# Patient Record
Sex: Male | Born: 1975 | Hispanic: Yes | Marital: Married | Smoking: Never smoker
Health system: Southern US, Community
[De-identification: ages and names within clinical notes are randomized; demographics above are authoritative.]

## PROBLEM LIST (undated history)

## (undated) DIAGNOSIS — I10 Essential (primary) hypertension: Secondary | ICD-10-CM

---

## 2013-10-19 ENCOUNTER — Ambulatory Visit: Payer: Self-pay

## 2015-04-06 ENCOUNTER — Encounter: Payer: Self-pay | Admitting: Gynecology

## 2015-04-06 ENCOUNTER — Ambulatory Visit
Admission: EM | Admit: 2015-04-06 | Discharge: 2015-04-06 | Disposition: A | Discharge status: Home / Self Care | Payer: BLUE CROSS/BLUE SHIELD | Attending: Internal Medicine | Admitting: Internal Medicine

## 2015-04-06 DIAGNOSIS — M5431 Sciatica, right side: Secondary | ICD-10-CM

## 2015-04-06 HISTORY — DX: Essential (primary) hypertension: I10

## 2015-04-06 MED ORDER — KETOROLAC TROMETHAMINE 60 MG/2ML IM SOLN
60.0000 mg | Freq: Once | INTRAMUSCULAR | Status: AC
  Administered 2015-04-06: 60 mg via INTRAMUSCULAR

## 2015-04-06 NOTE — ED Provider Notes (Signed)
CSN: [id number]     Arrival date & time 04/06/15  [id number] History   First MD Initiated Contact with Patient 04/06/15 1128     Chief Complaint  Patient presents with  . Hip Pain   (Consider location/radiation/quality/duration/timing/severity/associated sxs/prior Treatment) HPI 39 yo M has had right low back pain /right buttocks x 6 weeks. Works in Public relations account executivecement construction. Does a lot of pivotal heavy lifting. Has not taken a break. Had a big job under a deadline about 6 weeks ago- lifted heavier loads and  worked long hours -during which time the problem reportedly developed. Has not taken more than an occasional tylenol to treat.Notes discomfort when seated then rising after period of time. Upright walking is not uncomfortable Able to tolerate ibuprofen but usually takes tylenol at home if he takes anything. No paresthesia, no difficulty void, defecate.No thigh or lower leg discomfort. Work environment apparently influential in discomfort. Has mentioned to his boss but was not given any paperwork- so came "for myself" to visit today . Speaks with heavy accent and was offered use of interpreter but he deferred.  Past Medical History  Diagnosis Date  . Hypertension    History reviewed. No pertinent past surgical history. No family history on file. Social History  Substance Use Topics  . Smoking status: Never Smoker   . Smokeless tobacco: None  . Alcohol Use: No    Review of Systems Constitutional: No fever.  Eyes: No visual changes. ENT:No sore throat. Cardiovascular:Negative for chest pain/palpitations Respiratory: Negative for shortness of breath Gastrointestinal: No abdominal pain. No nausea,vomiting, diarrhea Genitourinary: Negative for dysuria. Normal urination. Musculoskeletal: Negative for upper back pain.Has right sacroiliac area discomfort. FROM extremities without pain, walks without pain Skin: Negative for rash Neurological: Negative for headache, focal weakness or  numbness  Allergies  Review of patient's allergies indicates no known allergies.  Home Medications   Prior to Admission medications   Medication Sig Start Date End Date Taking? Authorizing Provider  atenolol (TENORMIN) 100 MG tablet Take 100 mg by mouth daily.   Yes Historical Provider, MD   Meds Ordered and Administered this Visit   Medications  ketorolac (TORADOL) injection 60 mg (60 mg Intramuscular Given 04/06/15 1148)  Was very pleased to have relief of discomfort with RX  BP 127/88 mmHg  Pulse 60  Temp(Src) 97.1 F (36.2 C) (Tympanic)  Resp 18  Ht 5\' 7"  (1.702 m)  Wt 186 lb (84.369 kg)  BMI 29.12 kg/m2  SpO2 100% No data found.   Physical Exam   Constitutional -alert and oriented,well appearing and in no acute distress General: No  acute distress Head-atraumatic, normocephalic Eyes- conjunctiva normal, EOMI ,conjugate gaze Ears: grossly normal hearing Nose- no congestion or rhinorrhea Mouth/throat- mucous membranes moist ,oropharynx non-erythematous Neck- supple without glandular enlargement CV- regular rate and rhythmn Resp-no distress, normal respiratory effort,clear to auscultation bilaterally Back - no CVAT, no spinal or para-spinous tenderness. Positive discomfort with palpation right SI, no radiation; SLR is negative bilaterally;  GI- soft,non-tender,no distention GU-  not examined MSK- non tender, normal ROM, ambulatory, no gait instability,on /off table solo; DTRs and pulses are present and equal; can toe walk and heel walk , squat and return without support Neuro- normal speech and language, no gross focal neurological deficit appreciated,  Skin-warm,dry ,intact;  Psych-mood and affect grossly normal; speech and behavior grossly normal  ED Course  Procedures (including critical care time)  Labs Review Labs Reviewed - No data to display  Imaging Review No results found.  Medications  ketorolac (TORADOL) injection 60 mg (60 mg Intramuscular Given  04/06/15 1148)  well tolerated- very pleased with relief  Have given paperwork to review in both Spanish and [place]- for pt and family Oferred and accepted pain Rx- was very pleased with relief of discomfort w Toradol Discussed need to maintain PO Rx adequate for anti-inflammatory and pain relief; heat /ice Suggested 400-600mg  tid with meals for 2-3 days Body mechanics carefully reviewed- avoid twist and lift; avoid excessive or off-balance weights. Exercise sheet reviewed RTC [facility] PRN - report back with paresthesia or increased concerns  MDM   1. Sciatica of right side    Plan:  diagnosis reviewed with patient  Rx as per orders;  benefits, risks, potential side effects reviewed   Recommend supportive treatment with cyclic tylenol and ibuprofen Icy hot-athletic rubs-firm mattress Seek additional medical care if symptoms worsen or are not improving Return PRN- does not have PCP established yet-strongly encouraged to do so Plan return about 1-2 weeks for re-evalation unless resolved     Rae Halsted, PA-C 04/08/15 2300

## 2015-04-06 NOTE — Discharge Instructions (Signed)
Ibuprofen 600 mg ( 3 tablets )  3 times a day for 3 - 5 days with meals, then decrease to as needed 1-2 tablets every 6 hours  Ice packs  Careful activity-- do not lift and twist !  Limit lifting to light loads...turn and walk , do not twist with load in arms.  Citica  (Sciatica)  La citica es Chief Technology Officerel dolor, debilidad, entumecimiento u hormigueo a lo largo del nervio citico. El nervio comienza en la zona inferior de la espalda y desciende por la parte posterior de cada pierna. Una lesin en los nervios o ciertas enfermedades pueden causar un pellizco o Occupational hygienistejercer presin sobre el nervio citico. Esto causa dolor, debilidad y otras molestias de citica.  CUIDADOS EN EL HOGAR   Slo tome los medicamentos segn le indique el mdico.  Aplique hielo en el rea afectada durante 20 minutos. Repita 3-4 veces al da durante las primeras 48-72 horas. Luego intente aplicar calor de la misma manera.  Haga ejercicios, elongue o realice sus actividades habituales, si no le causan ms dolor.  Concurra a fisioterapia segn lo indicado por su mdico.  Cumpla con las visitas al mdico segn las indicaciones.  No use tacones altos o zapatos que no tengan buen apoyo.  Consiga un colchn firme si su colchn es demasiado blando para disminuir el dolor y Environmental health practitionerel malestar. SOLICITE AYUDA DE INMEDIATO SI:   No puede controlar su materia fecal (movimientos intestinales) o el pis orina.  Siente debilidad en la zona inferior de la espalda, bajo vientre (pelvis), en las nalgas (glteos).  Siente irritacin o hinchazn (inflamacin) en la espalda.  Tiene sensacin de ardor al ConocoPhillipsorinar.  El dolor empeora cuando se [place].  El dolor lo despierta al dormir.  Su dolor es ms intenso que en el pasado.  Los sntomas duran ms de 4 semanas.  Pierde peso sin motivo de [place] sbita. ASEGRESE DE QUE:   Comprende estas instrucciones.  Controlar la enfermedad.  Solicitar ayuda de inmediato si no mejora o si  empeora.   Esta informacin no tiene Theme park managercomo fin reemplazar el consejo del mdico. Asegrese de hacerle al mdico cualquier pregunta que tenga.   Document Released: 06/06/2010 Document Revised: 01/23/2015 Elsevier Interactive Patient Education [employer]2016 Elsevier Inc.

## 2015-04-06 NOTE — ED Notes (Signed)
Patient c/o right hip pain x 6 weeks. Per pt after sitting for and then get up felt pain. Pt. Stated when walking no pain.

## 2015-04-08 ENCOUNTER — Encounter: Payer: Self-pay | Admitting: Physician Assistant

## 2016-09-22 ENCOUNTER — Encounter: Payer: Self-pay | Admitting: Emergency Medicine

## 2016-09-22 ENCOUNTER — Emergency Department
Admission: EM | Admit: 2016-09-22 | Discharge: 2016-09-22 | Disposition: A | Discharge status: Home / Self Care | Payer: Worker's Compensation | Attending: Emergency Medicine | Admitting: Emergency Medicine

## 2016-09-22 ENCOUNTER — Emergency Department: Payer: Worker's Compensation

## 2016-09-22 DIAGNOSIS — M791 Myalgia: Secondary | ICD-10-CM | POA: Diagnosis not present

## 2016-09-22 DIAGNOSIS — Z8781 Personal history of (healed) traumatic fracture: Secondary | ICD-10-CM | POA: Diagnosis not present

## 2016-09-22 DIAGNOSIS — R0781 Pleurodynia: Secondary | ICD-10-CM | POA: Diagnosis present

## 2016-09-22 DIAGNOSIS — M7918 Myalgia, other site: Secondary | ICD-10-CM

## 2016-09-22 MED ORDER — NAPROXEN 500 MG PO TABS: 500.0000 mg | ORAL_TABLET | Freq: Two times a day (BID) | ORAL | 0 refills | Status: AC
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
